# Patient Record
Sex: Female | Born: 1965 | Race: Black or African American | Hispanic: No | Marital: Married | Smoking: Never smoker
Health system: Southern US, Community
[De-identification: ages and names within clinical notes are randomized; demographics above are authoritative.]

## PROBLEM LIST (undated history)

## (undated) DIAGNOSIS — J45909 Unspecified asthma, uncomplicated: Secondary | ICD-10-CM

## (undated) DIAGNOSIS — E559 Vitamin D deficiency, unspecified: Secondary | ICD-10-CM

## (undated) HISTORY — PX: ABDOMINAL HYSTERECTOMY: SHX81

## (undated) HISTORY — PX: TUBAL LIGATION: SHX77

---

## 2004-12-26 ENCOUNTER — Inpatient Hospital Stay: Payer: Self-pay | Admitting: Unknown Physician Specialty

## 2007-06-04 ENCOUNTER — Ambulatory Visit: Payer: Self-pay | Admitting: Family Medicine

## 2007-06-04 ENCOUNTER — Emergency Department: Payer: Self-pay | Admitting: Emergency Medicine

## 2007-06-07 ENCOUNTER — Emergency Department: Payer: Self-pay | Admitting: Emergency Medicine

## 2007-06-07 ENCOUNTER — Ambulatory Visit: Payer: Self-pay | Admitting: Internal Medicine

## 2008-09-13 ENCOUNTER — Ambulatory Visit: Payer: Self-pay | Admitting: Internal Medicine

## 2009-01-20 IMAGING — CT CT ABD-PELV W/ CM
1 of 2 series · 16 of 32 positions shown, 20 images · non-contrast
Comparison: none

REASON FOR EXAM: (1) rlq pain elevated wbc; (2) see above
COMMENTS:

PROCEDURE:     CT  - CT ABDOMEN / PELVIS  W  - June 07, 2007 [DATE]
RESULT:
HISTORY: Pain.

[Series 2: appendicitis · axial · 0.74mm/px · z∈[-892,-484]mm · 16 of 148 slices shown, 20 images]
[im 6/148  soft-tissue]
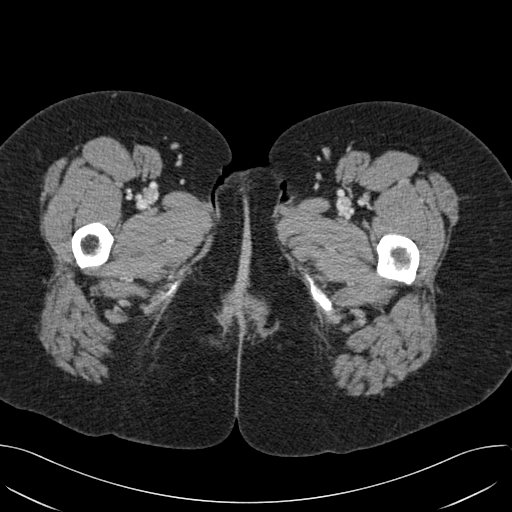
[im 6/148  bone]
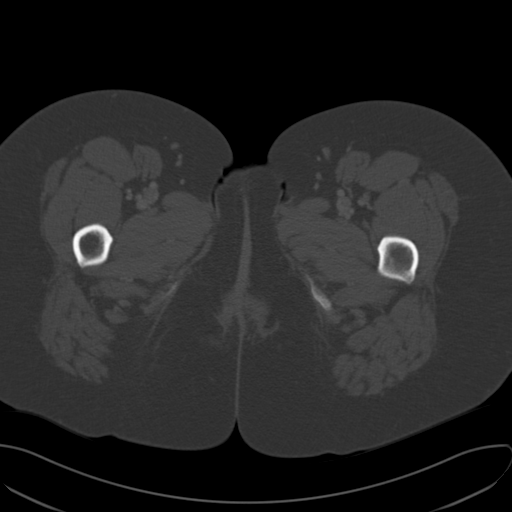
[im 18/148  soft-tissue]
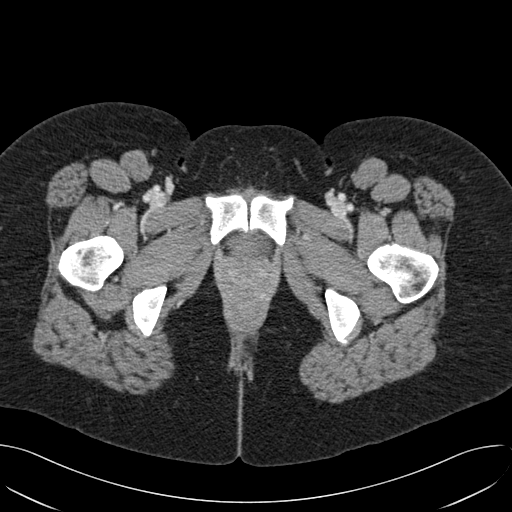
[im 30/148  soft-tissue]
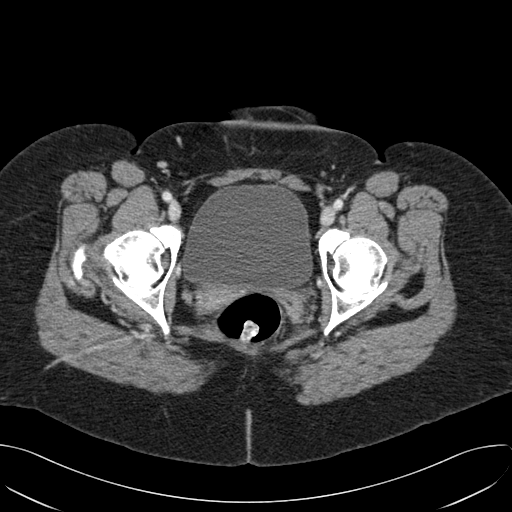
[im 42/148  soft-tissue]
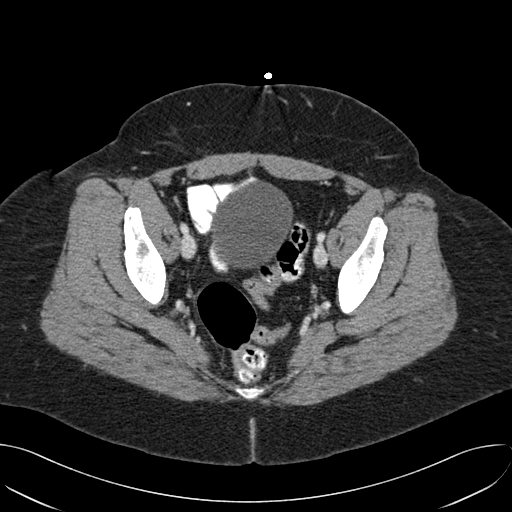
[im 48/148  soft-tissue]
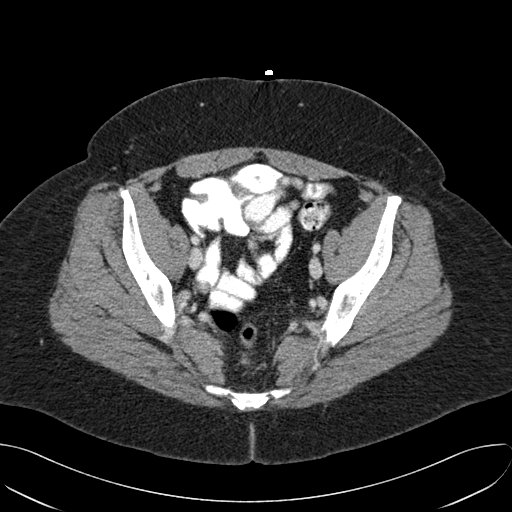
[im 59/148  soft-tissue]
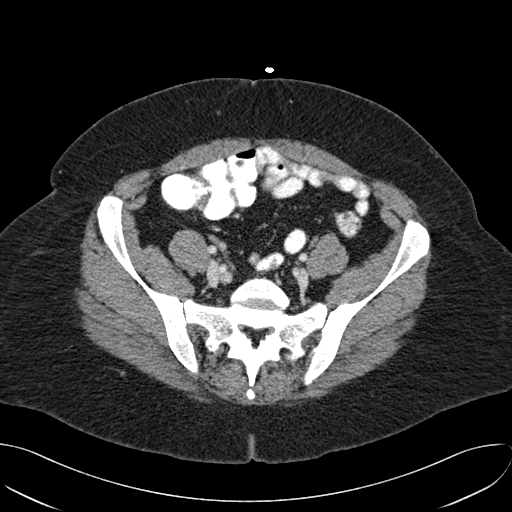
[im 71/148  soft-tissue]
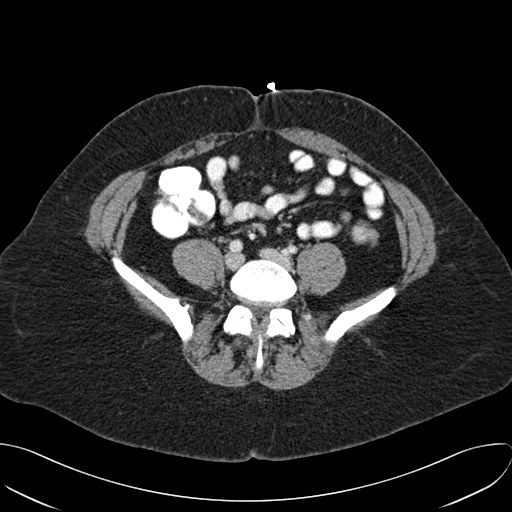
[im 77/148  soft-tissue]
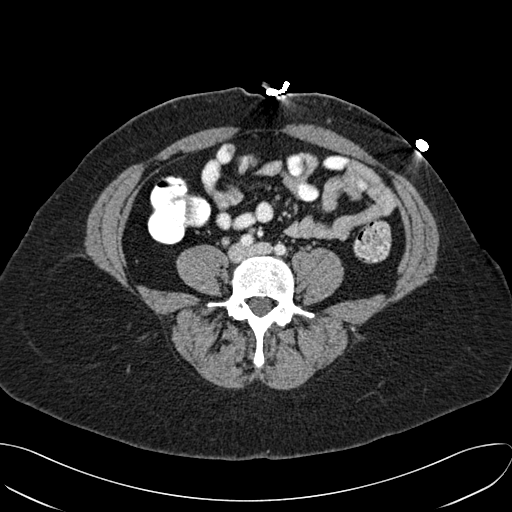
[im 89/148  soft-tissue]
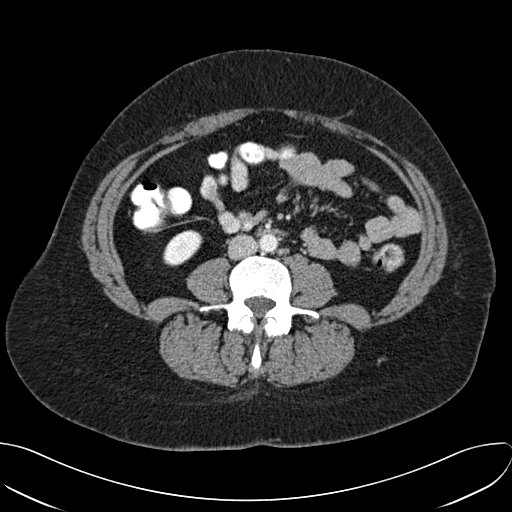
[im 89/148  bone]
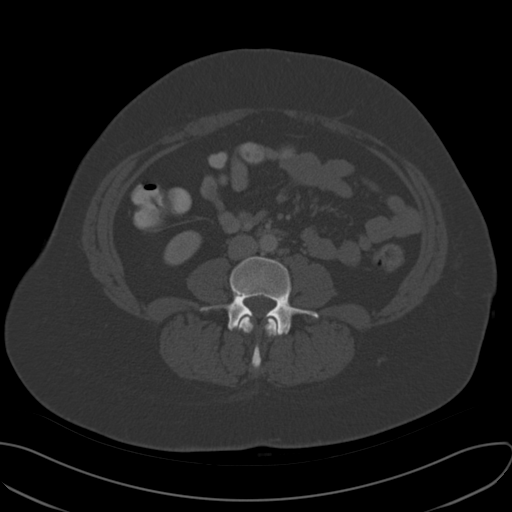
[im 100/148  soft-tissue]
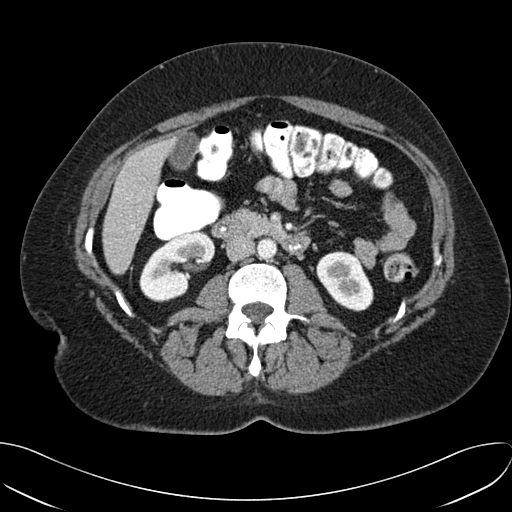
[im 112/148  soft-tissue]
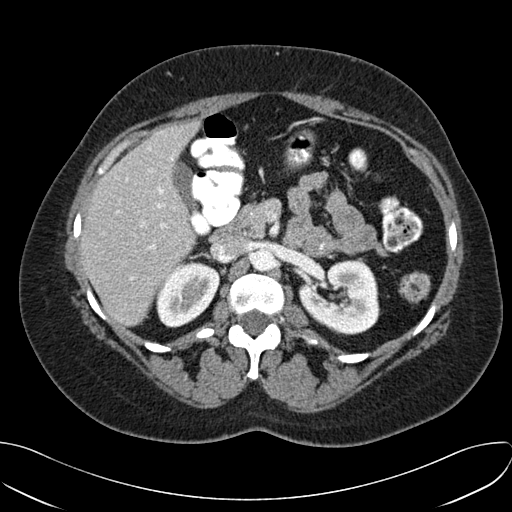
[im 118/148  soft-tissue]
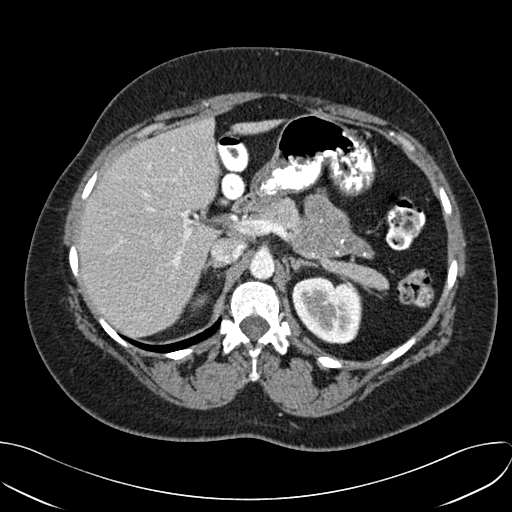
[im 124/148  lung]
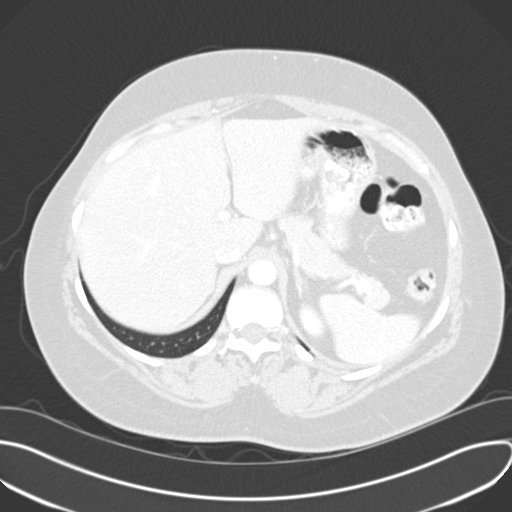
[im 130/148  soft-tissue]
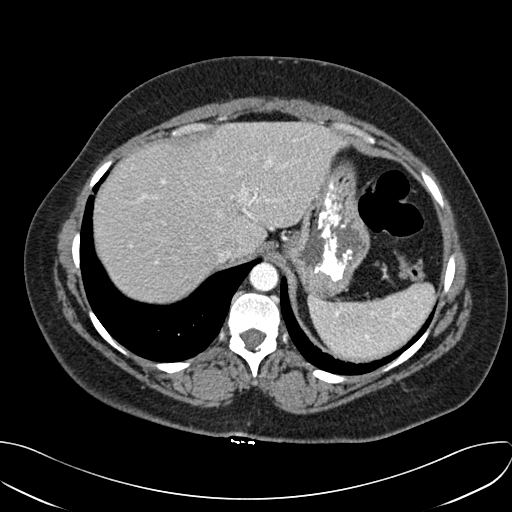
[im 130/148  lung]
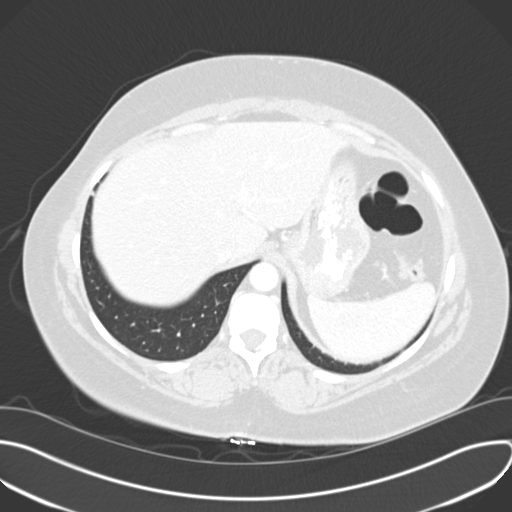
[im 136/148  lung]
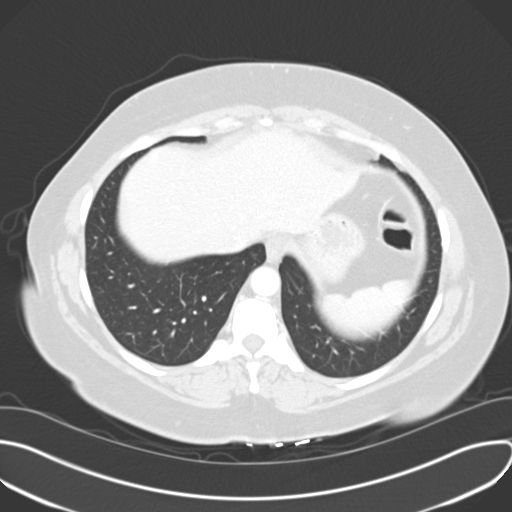
[im 142/148  soft-tissue]
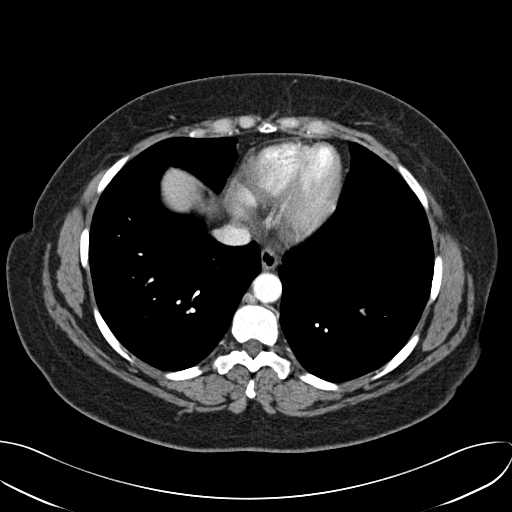
[im 142/148  lung]
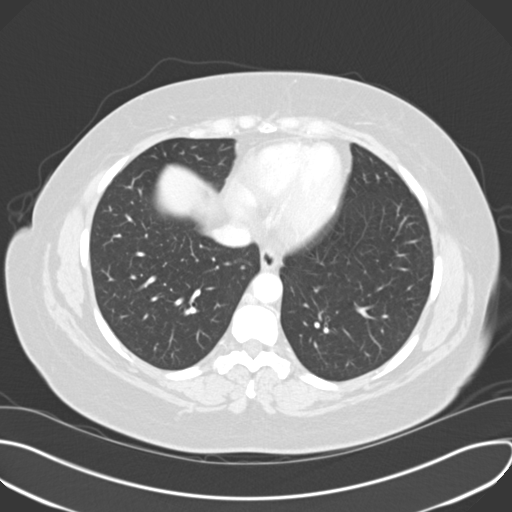

[16 of 32 positions shown; findings below may reference images not displayed]

COMPARISON STUDIES: No recent.

PROCEDURE AND FINDINGS: IV and oral contrast enhanced CT of the abdomen and
pelvis is obtained. The liver and spleen are normal. The adrenals are
normal. The pancreas is normal. The kidneys are normal. There is no evidence
of hydronephrosis. The bladder is unremarkable. The pelvis is unremarkable.
There is no bowel distention. The RIGHT lower quadrant is unremarkable. The
appendix is normal. No inguinal adenopathy is noted. The lung bases are
clear. There is no free air.
IMPRESSION: No acute or focal abnormalities are identified.

## 2009-06-06 ENCOUNTER — Ambulatory Visit: Payer: Self-pay | Admitting: Internal Medicine

## 2010-06-02 ENCOUNTER — Ambulatory Visit: Payer: Self-pay | Admitting: Family Medicine

## 2010-06-03 ENCOUNTER — Ambulatory Visit: Payer: Self-pay | Admitting: Family Medicine

## 2010-10-01 ENCOUNTER — Ambulatory Visit: Payer: Self-pay | Admitting: Family Medicine

## 2011-01-20 IMAGING — CR DG ANKLE COMPLETE 3+V*L*
1 series · 5 of 5 positions shown · non-contrast
Comparison: none

REASON FOR EXAM: injury - pain
COMMENTS:

[Series 1: view not recorded · 0.17mm/px · 5 of 5 slices shown]
[im 1/5]
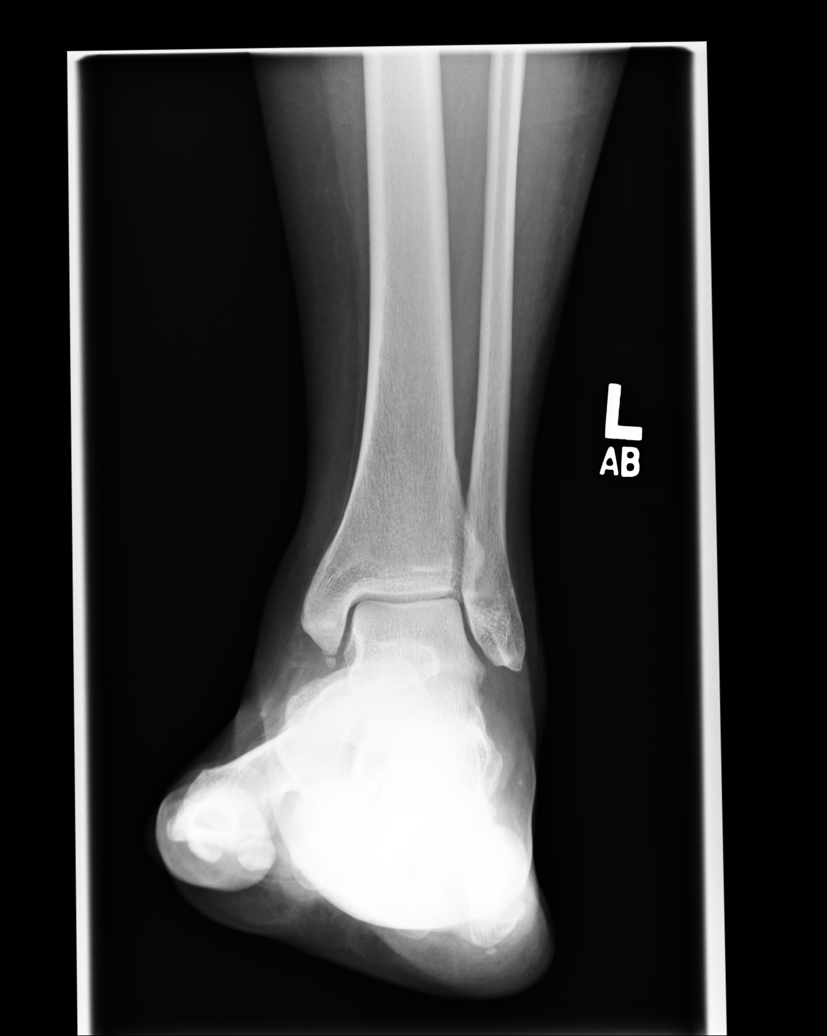
[im 2/5]
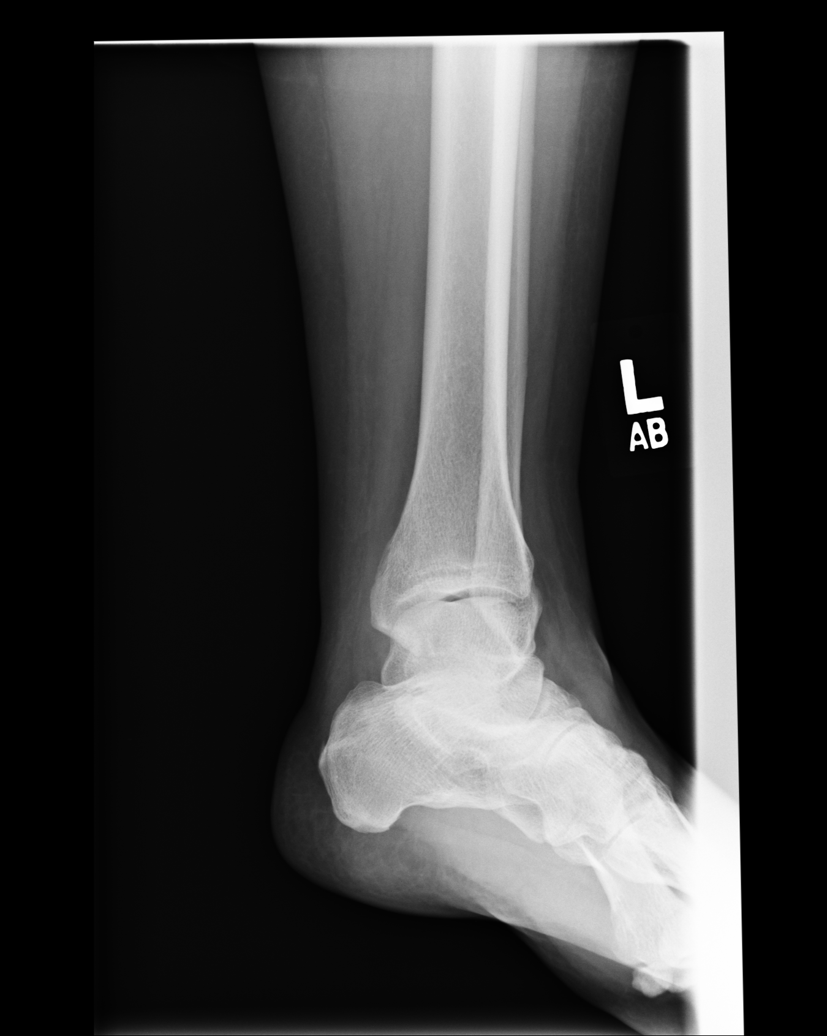
[im 3/5]
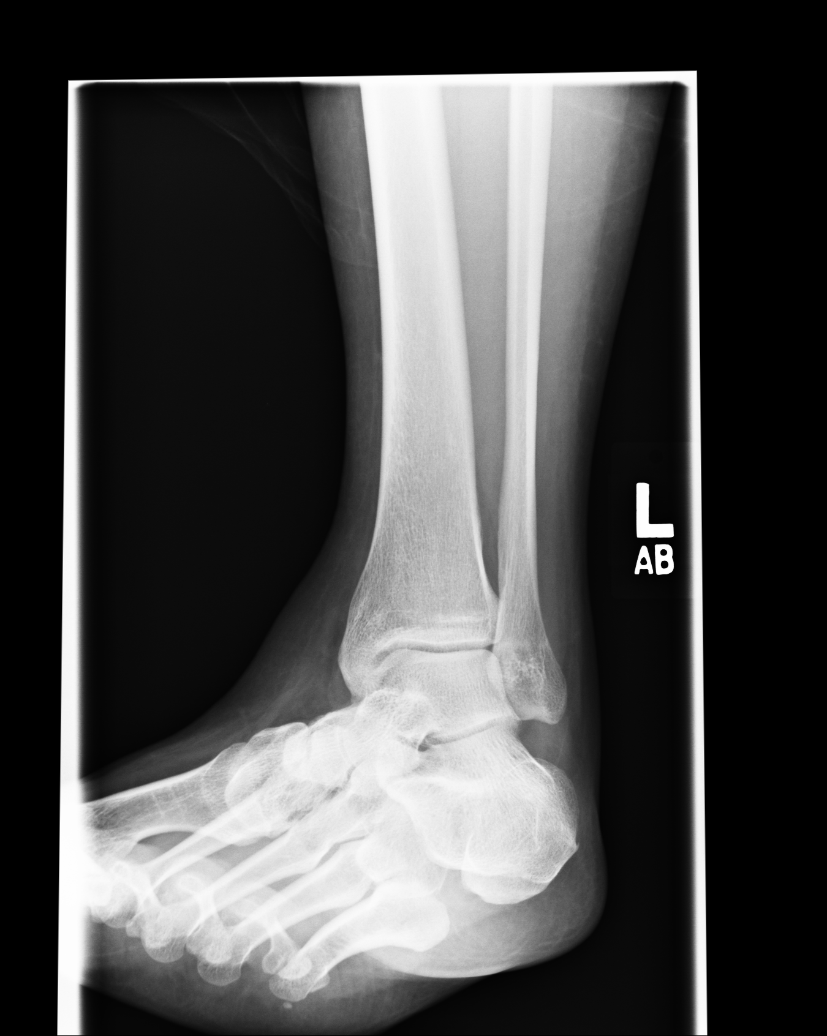
[im 4/5]
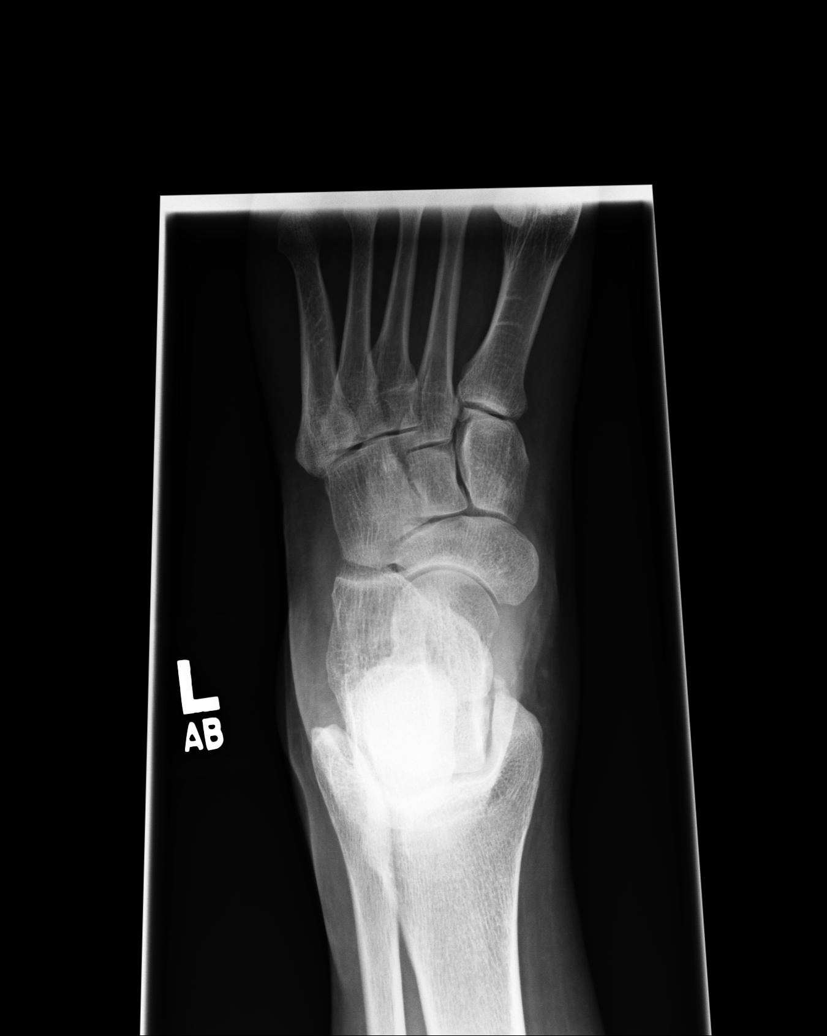
[im 5/5]
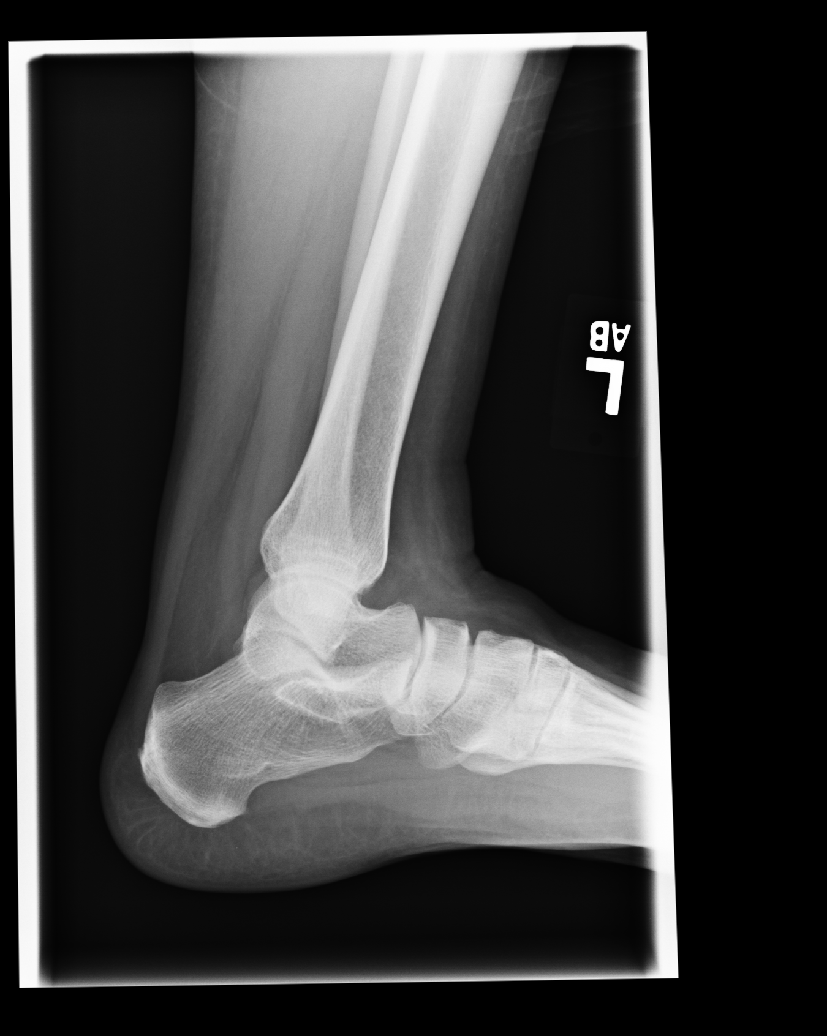

[5 of 5 positions shown; findings below may reference images not displayed]

PROCEDURE:     MDR - MDR ANKLE LEFT COMPLETE  - June 06, 2009  [DATE]

RESULT:     No fracture, dislocation or other acute bony abnormality is
identified. Incidental note is made of an osseous density at the inferior
tip of the medial malleolus compatible with a small sesamoid bone. The ankle
mortise is symmetrical.
IMPRESSION: 1.     No acute bony abnormalities are identified.

## 2012-08-20 ENCOUNTER — Ambulatory Visit: Payer: Self-pay | Admitting: Internal Medicine

## 2013-04-14 ENCOUNTER — Ambulatory Visit: Payer: Self-pay

## 2013-09-08 ENCOUNTER — Ambulatory Visit: Payer: Self-pay | Admitting: Physician Assistant

## 2015-03-02 ENCOUNTER — Other Ambulatory Visit: Payer: Self-pay

## 2015-03-02 ENCOUNTER — Ambulatory Visit
Admission: EM | Admit: 2015-03-02 | Discharge: 2015-03-02 | Disposition: A | Discharge status: Home / Self Care | Payer: Managed Care, Other (non HMO) | Attending: Family Medicine | Admitting: Family Medicine

## 2015-03-02 DIAGNOSIS — R42 Dizziness and giddiness: Secondary | ICD-10-CM | POA: Diagnosis not present

## 2015-03-02 DIAGNOSIS — Z91048 Other nonmedicinal substance allergy status: Secondary | ICD-10-CM

## 2015-03-02 DIAGNOSIS — J4530 Mild persistent asthma, uncomplicated: Secondary | ICD-10-CM | POA: Diagnosis not present

## 2015-03-02 DIAGNOSIS — T7849XA Other allergy, initial encounter: Secondary | ICD-10-CM | POA: Insufficient documentation

## 2015-03-02 DIAGNOSIS — Z79899 Other long term (current) drug therapy: Secondary | ICD-10-CM | POA: Insufficient documentation

## 2015-03-02 DIAGNOSIS — Z9109 Other allergy status, other than to drugs and biological substances: Secondary | ICD-10-CM

## 2015-03-02 DIAGNOSIS — R0602 Shortness of breath: Secondary | ICD-10-CM | POA: Diagnosis present

## 2015-03-02 HISTORY — DX: Vitamin D deficiency, unspecified: E55.9

## 2015-03-02 HISTORY — DX: Unspecified asthma, uncomplicated: J45.909

## 2015-03-02 LAB — GLUCOSE, CAPILLARY: Glucose-Capillary: 92 mg/dL (ref 65–99)

## 2015-03-02 NOTE — ED Provider Notes (Signed)
CSN: [id number]     Arrival date & time 03/02/15  1702 History   First MD Initiated Contact with Patient 03/02/15 1739     Chief Complaint  Patient presents with  . Shortness of Breath  . Dizziness   (Consider location/radiation/quality/duration/timing/severity/associated sxs/prior Treatment) HPI Comments: 49 yo female with a h/o allergies and mild persistent asthma on advair and albuterol presents with a 1 month h/o intermittent shortness of breath and now 1 week of intermittent dizziness/lightheadedness. States has had allergy symptoms and takes claritin for it. Feels ear pressure on both ears. Denies fevers, chills, sore throat, cough, wheezing, vomiting, chest pains, neck or jaw pain, vision changes, numbness/tingling.   Patient is a 49 y.o. female presenting with shortness of breath and dizziness. The history is provided by the patient.  Shortness of Breath Dizziness Associated symptoms: shortness of breath     Past Medical History  Diagnosis Date  . Asthma   . Vitamin D deficiency    Past Surgical History  Procedure Laterality Date  . Abdominal hysterectomy    . Tubal ligation     No family history on file. History  Substance Use Topics  . Smoking status: Never Smoker   . Smokeless tobacco: Never Used  . Alcohol Use: No   OB History    Gravida Para Term Preterm AB TAB SAB Ectopic Multiple Living   4 4             Review of Systems  Respiratory: Positive for shortness of breath.   Neurological: Positive for dizziness.    Allergies  Review of patient's allergies indicates no known allergies.  Home Medications   Prior to Admission medications   Medication Sig Start Date End Date Taking? Authorizing Provider  albuterol (PROVENTIL) (2.5 MG/3ML) 0.083% nebulizer solution Take 2.5 mg by nebulization every 6 (six) hours as needed for wheezing or shortness of breath.   Yes Historical Provider, MD  Fluticasone-Salmeterol (ADVAIR) 100-50 MCG/DOSE AEPB Inhale 1 puff into  the lungs at bedtime.   Yes Historical Provider, MD  loratadine (CLARITIN) 10 MG tablet Take 10 mg by mouth daily.   Yes Historical Provider, MD  Vitamin D, Ergocalciferol, (DRISDOL) 50000 UNITS CAPS capsule Take 50,000 Units by mouth every 7 (seven) days.   Yes Historical Provider, MD   BP 113/64 mmHg  Pulse 76  Temp(Src) 98.4 F (36.9 C) (Oral)  Resp 16  Ht 5\' 4"  (1.626 m)  Wt 196 lb (88.905 kg)  BMI 33.63 kg/m2  SpO2 99% Physical Exam  Constitutional: She is oriented to person, place, and time. She appears well-developed and well-nourished. No distress.  HENT:  Head: Normocephalic.  Right Ear: External ear and ear canal normal. A middle ear effusion is present.  Left Ear: External ear and ear canal normal. A middle ear effusion is present.  Nose: Nose normal.  Mouth/Throat: Oropharynx is clear and moist and mucous membranes are normal.  Eyes: Conjunctivae and EOM are normal. Pupils are equal, round, and reactive to light. Right eye exhibits no discharge. Left eye exhibits no discharge. No scleral icterus.  Neck: Normal range of motion. Neck supple. No JVD present. No tracheal deviation present. No thyromegaly present.  Cardiovascular: Normal rate, regular rhythm, normal heart sounds and intact distal pulses.   No murmur heard. Pulmonary/Chest: Effort normal and breath sounds normal. No stridor. No respiratory distress. She has no wheezes. She has no rales. She exhibits no tenderness.  Musculoskeletal: She exhibits no edema.  Lymphadenopathy:  She has no cervical adenopathy.  Neurological: She is alert and oriented to person, place, and time.  Skin: Skin is warm and dry. No rash noted. She is not diaphoretic.  Vitals reviewed.   ED Course  Procedures (including critical care time) Labs Review Labs Reviewed  GLUCOSE, CAPILLARY  CBG MONITORING, ED    Imaging Review No results found.  EKG: normal EKG, normal sinus rhythm, there are no previous tracings available for  comparison.  MDM   1. Environmental allergies   2. Episodic lightheadedness    Plan: 1. Test/x-ray results and diagnosis reviewed with patient 2. Recommend supportive treatment with otc antihistamines and decongestant; increase fluids; continue current asthma medications 3. F/u with PCP or here prn if symptoms worsen or don't improve    Payton Mccallum, MD 03/02/15 [phone]

## 2015-03-02 NOTE — ED Notes (Signed)
Pt reports she is experiencing SOB (h/o asthma) x 1 month, dizziness/lightheadedness x 1 week, "slight" headache, chest discomfort, sweats. Pt denies arm/back/jaw pain, N/V.

## 2017-11-25 ENCOUNTER — Other Ambulatory Visit: Payer: Self-pay

## 2017-11-25 ENCOUNTER — Ambulatory Visit
Admission: EM | Admit: 2017-11-25 | Discharge: 2017-11-25 | Disposition: A | Discharge status: Home / Self Care | Payer: 59 | Attending: Family Medicine | Admitting: Family Medicine

## 2017-11-25 ENCOUNTER — Encounter: Payer: Self-pay | Admitting: Emergency Medicine

## 2017-11-25 DIAGNOSIS — R0981 Nasal congestion: Secondary | ICD-10-CM

## 2017-11-25 DIAGNOSIS — J029 Acute pharyngitis, unspecified: Secondary | ICD-10-CM

## 2017-11-25 DIAGNOSIS — R509 Fever, unspecified: Secondary | ICD-10-CM | POA: Diagnosis not present

## 2017-11-25 LAB — RAPID STREP SCREEN (MED CTR MEBANE ONLY): Streptococcus, Group A Screen (Direct): NEGATIVE

## 2017-11-25 MED ORDER — LIDOCAINE VISCOUS 2 % MT SOLN: 10.0000 mL | Freq: Three times a day (TID) | OROMUCOSAL | 0 refills | Status: AC | PRN

## 2017-11-25 MED ORDER — AMOXICILLIN 875 MG PO TABS: 875.0000 mg | ORAL_TABLET | Freq: Two times a day (BID) | ORAL | 0 refills | Status: AC

## 2017-11-25 NOTE — Discharge Instructions (Addendum)
Take medication as prescribed. Rest. Drink plenty of fluids.  ° °Follow up with your primary care physician this week as needed. Return to Urgent care for new or worsening concerns.  ° °

## 2017-11-25 NOTE — ED Triage Notes (Signed)
Patient c/o neck pain, chills, sore throat that started on Monday.  Patient reports fevers.

## 2017-11-25 NOTE — ED Provider Notes (Signed)
[facility] ____________________________________________  Time seen: Approximately 1240 PM  I have reviewed the triage vital signs and the nursing notes.   HISTORY  Chief Complaint Sore Throat   HPI Carla Hansen is a 52 y.o. female presenting for evaluation of 2 days of sore throat.  States some chills and fever, reporting T-max of 101.3 yesterday.  Has taken some over-the-counter Tylenol and ibuprofen.  States last took ibuprofen earlier this morning.  States some pain with swallowing but continues to overall eat and drink well.  States mild nasal congestion consistent with her chronic seasonal allergies without change.  Denies cough.  Denies known sick contacts.  Reports otherwise feels well.  Denies other aggravating or relieving factors. Denies chest pain, shortness of breath, abdominal pain, or rash. Denies recent sickness. Denies recent antibiotic use.     Past Medical History:  Diagnosis Date  . Asthma   . Vitamin D deficiency     There are no active problems to display for this patient.   Past Surgical History:  Procedure Laterality Date  . ABDOMINAL HYSTERECTOMY    . TUBAL LIGATION       No current facility-administered medications for this encounter.   Current Outpatient Medications:  .  baclofen (LIORESAL) 10 MG tablet, , Disp: , Rfl: 0 .  levothyroxine (SYNTHROID, LEVOTHROID) 25 MCG tablet, Take by mouth., Disp: , Rfl:  .  loratadine (CLARITIN) 10 MG tablet, Take 10 mg by mouth daily., Disp: , Rfl:  .  Vitamin D, Ergocalciferol, (DRISDOL) 50000 UNITS CAPS capsule, Take 50,000 Units by mouth every 7 (seven) days., Disp: , Rfl:  .  albuterol (PROVENTIL) (2.5 MG/3ML) 0.083% nebulizer solution, Take 2.5 mg by nebulization every 6 (six) hours as needed for wheezing or shortness of breath., Disp: , Rfl:  .  amoxicillin (AMOXIL) 875 MG tablet, Take 1 tablet (875 mg total) by mouth 2 (two) times daily., Disp: 20 tablet, Rfl: 0 .  lidocaine (XYLOCAINE) 2 %  solution, Use as directed 10 mLs in the mouth or throat every 8 (eight) hours as needed (sore throat. gargle and spit as needed for sore throat.)., Disp: 85 mL, Rfl: 0  Allergies Patient has no known allergies.  History reviewed. No pertinent family history.  Social History Social History   Tobacco Use  . Smoking status: Never Smoker  . Smokeless tobacco: Never Used  Substance Use Topics  . Alcohol use: No  . Drug use: No    Review of Systems Constitutional:as above.  Eyes: No visual changes. ENT: positive sore throat. Cardiovascular: Denies chest pain. Respiratory: Denies shortness of breath. Gastrointestinal: No abdominal pain.  No nausea, no vomiting.  No diarrhea.  Musculoskeletal: Negative for back pain. Skin: Negative for rash.  ____________________________________________   PHYSICAL EXAM:  VITAL SIGNS: ED Triage Vitals  Enc Vitals Group     BP 11/25/17 1155 (!) 116/59     Pulse Rate 11/25/17 1155 71     Resp 11/25/17 1155 16     Temp 11/25/17 1155 98 F (36.7 C)     Temp Source 11/25/17 1155 Oral     SpO2 11/25/17 1155 98 %     Weight 11/25/17 1152 228 lb (103.4 kg)     Height 11/25/17 1152 5\' 4"  (1.626 m)     Head Circumference --      Peak Flow --      Pain Score 11/25/17 1151 5     Pain Loc --      Pain  Edu? --      Excl. in GC? --    Constitutional: Alert and oriented. Well appearing and in no acute distress. Eyes: Conjunctivae are normal.  Head: Atraumatic. No sinus tenderness to palpation. No swelling. No erythema.  Ears: no erythema, normal TMs bilaterally.   Nose:No nasal congestion   Mouth/Throat: Mucous membranes are moist. Mild pharyngeal erythema.  Mild bilateral tonsillar swelling, mild bilateral exudate.  No uvular shift or deviation. Neck: No stridor.  No cervical spine tenderness to palpation. Hematological/Lymphatic/Immunilogical: Anterior bilateral cervical lymphadenopathy. Cardiovascular: Normal rate, regular rhythm. Grossly normal  heart sounds.  Good peripheral circulation. Respiratory: Normal respiratory effort.  No retractions. No wheezes, rales or rhonchi. Good air movement.  Musculoskeletal: Ambulatory with steady gait.  Neurologic:  Normal speech and language. No gait instability. Skin:  Skin appears warm, dry and intact. No rash noted. Psychiatric: Mood and affect are normal. Speech and behavior are normal.  ___________________________________________   LABS (all labs ordered are listed, but only abnormal results are displayed)  Labs Reviewed  RAPID STREP SCREEN (NOT AT [facility])  CULTURE, GROUP A STREP [facility])    PROCEDURES Procedures   INITIAL IMPRESSION / ASSESSMENT AND PLAN / ED COURSE  Pertinent labs & imaging results that were available during my care of the patient were reviewed by me and considered in my medical decision making (see chart for details).  Well-appearing patient.  No acute distress.  Quick strep negative, will culture.  However clinical exam consistent with streptococcal pharyngitis.  Will empirically treat with oral amoxicillin.  Encourage rest, fluids, supportive care.  Also Rx for PRN viscous lidocaine solution.Discussed indication, risks and benefits of medications with patient.  Discussed follow up with Primary care physician this week. Discussed follow up and return parameters including no resolution or any worsening concerns. Patient verbalized understanding and agreed to plan.   ____________________________________________   FINAL CLINICAL IMPRESSION(S) / ED DIAGNOSES  Final diagnoses:  Pharyngitis, unspecified etiology     ED Discharge Orders        Ordered    amoxicillin (AMOXIL) 875 MG tablet  2 times daily     11/25/17 1237    lidocaine (XYLOCAINE) 2 % solution  Every 8 hours PRN     11/25/17 1237       Note: This dictation was prepared with Dragon dictation along with smaller phrase technology. Any transcriptional errors that result from this process are  unintentional.         Renford Dills, NP 11/25/17 [phone]

## 2017-11-26 ENCOUNTER — Telehealth (HOSPITAL_COMMUNITY): Payer: Self-pay

## 2017-11-26 NOTE — Telephone Encounter (Signed)
Attempted to contact patient regarding negative test results.

## 2017-11-27 LAB — CULTURE, GROUP A STREP (THRC)

## 2019-03-03 ENCOUNTER — Other Ambulatory Visit: Payer: Self-pay | Admitting: Family Medicine

## 2019-03-03 DIAGNOSIS — Z20822 Contact with and (suspected) exposure to covid-19: Secondary | ICD-10-CM

## 2019-03-08 LAB — NOVEL CORONAVIRUS, NAA: SARS-CoV-2, NAA: NOT DETECTED

## 2019-03-15 ENCOUNTER — Telehealth: Payer: Self-pay | Admitting: General Practice

## 2019-03-15 NOTE — Telephone Encounter (Signed)
Pt called and negative results was given to pt °

## 2021-04-24 ENCOUNTER — Other Ambulatory Visit: Payer: Self-pay

## 2021-04-24 ENCOUNTER — Ambulatory Visit
Admission: EM | Admit: 2021-04-24 | Discharge: 2021-04-24 | Disposition: A | Discharge status: Home / Self Care | Payer: 59 | Attending: Sports Medicine | Admitting: Sports Medicine

## 2021-04-24 DIAGNOSIS — R519 Headache, unspecified: Secondary | ICD-10-CM | POA: Diagnosis present

## 2021-04-24 DIAGNOSIS — J069 Acute upper respiratory infection, unspecified: Secondary | ICD-10-CM | POA: Diagnosis not present

## 2021-04-24 DIAGNOSIS — R059 Cough, unspecified: Secondary | ICD-10-CM

## 2021-04-24 DIAGNOSIS — B349 Viral infection, unspecified: Secondary | ICD-10-CM | POA: Insufficient documentation

## 2021-04-24 DIAGNOSIS — R0981 Nasal congestion: Secondary | ICD-10-CM | POA: Insufficient documentation

## 2021-04-24 DIAGNOSIS — R195 Other fecal abnormalities: Secondary | ICD-10-CM | POA: Diagnosis present

## 2021-04-24 DIAGNOSIS — U071 COVID-19: Secondary | ICD-10-CM | POA: Insufficient documentation

## 2021-04-24 LAB — SARS CORONAVIRUS 2 (TAT 6-24 HRS): SARS Coronavirus 2: POSITIVE — AB

## 2021-04-24 MED ORDER — FLUTICASONE PROPIONATE 50 MCG/ACT NA SUSP: 2.0000 | Freq: Every day | NASAL | 0 refills | Status: AC

## 2021-04-24 MED ORDER — PROMETHAZINE-DM 6.25-15 MG/5ML PO SYRP: 5.0000 mL | ORAL_SOLUTION | Freq: Four times a day (QID) | ORAL | 0 refills | Status: AC | PRN

## 2021-04-24 NOTE — Discharge Instructions (Addendum)
As we discussed, your COVID test was sent to the hospital.  Please isolate until you know your results.  If they are positive you will need to quarantine per the current CDC guidelines.  Someone will contact you with a positive result and update your work note.  They may not always contact you with a negative result.  Please follow along with your results on the app called MyChart. Please see educational handouts. Supportive care, over-the-counter meds as needed for any symptoms you have. Tylenol or Motrin for any fever or discomfort.  Plenty of rest and plenty fluids. I did provide you a work note. I also sent in a medication for cough, and another 1 for your nasal congestion. If your symptoms persist please see your primary care provider. If your symptoms do worsen then please go to the emergency room.

## 2021-04-24 NOTE — ED Triage Notes (Signed)
Pt c/o nasal congestion, headaches, cough and chills since Sunday. Pt states symptoms worse yesterday. Pt denies f/c/n/v, did have several episodes of diarrhea this morning.

## 2021-04-24 NOTE — ED Provider Notes (Signed)
[facility]    CSN: [id number] Arrival date & time: 04/24/21  0808      History   Chief Complaint Chief Complaint  Patient presents with   Cough   Nasal Congestion   Diarrhea    HPI Carla Hansen is a 55 y.o. female.   55 year old female who presents for evaluation of URI symptoms.  She developed cough, nasal congestion, headache, and chills without fever on Sunday.  Her symptoms have been present now for 3 to 4 days.  Initially she thought it was allergies.  Started having some loose stools this morning.  Was unable to get into her primary care provider, which is Dr. Theodis Aguas.  Presents to the urgent care for initial evaluation.  She denies any fevers or shakes.  No abdominal pain, nausea, or vomiting.  No urinary symptoms.  No chest pain or shortness of breath.  She does have a history of asthma and does take Advair as well as albuterol as needed.  She denies any wheezing.  She did do a home COVID test yesterday which was negative.  She is asking for another COVID test.  No flulike symptoms.  No red flag signs or symptoms elicited on history.   Past Medical History:  Diagnosis Date   Asthma    Vitamin D deficiency     There are no problems to display for this patient.   Past Surgical History:  Procedure Laterality Date   ABDOMINAL HYSTERECTOMY     TUBAL LIGATION      OB History     Gravida  4   Para  4   Term      Preterm      AB      Living         SAB      IAB      Ectopic      Multiple      Live Births               Home Medications    Prior to Admission medications   Medication Sig Start Date End Date Taking? Authorizing Provider  albuterol (PROVENTIL) (2.5 MG/3ML) 0.083% nebulizer solution Take 2.5 mg by nebulization every 6 (six) hours as needed for wheezing or shortness of breath.   Yes [provider]  albuterol (VENTOLIN HFA) 108 (90 Base) MCG/ACT inhaler Inhale into the lungs every 6 (six) hours as needed for  wheezing or shortness of breath.   Yes [provider]  EPINEPHrine 0.3 mg/0.3 mL IJ SOAJ injection epinephrine 0.3 mg/0.3 mL injection, auto-injector   Yes [provider]  fluticasone (FLONASE) 50 MCG/ACT nasal spray Place 2 sprays into both nostrils daily. 04/24/21  Yes Delton See, MD  fluticasone-salmeterol (ADVAIR) 250-50 MCG/ACT AEPB SMARTSIG:1 Puff(s) Via Inhaler Every 12 Hours 12/03/20  Yes [provider]  levothyroxine (SYNTHROID, LEVOTHROID) 25 MCG tablet Take by mouth. 04/01/17 04/24/21 Yes [provider]  loratadine (CLARITIN) 10 MG tablet Take 10 mg by mouth daily.   Yes [provider]  promethazine-dextromethorphan (PROMETHAZINE-DM) 6.25-15 MG/5ML syrup Take 5 mLs by mouth 4 (four) times daily as needed for cough. 04/24/21  Yes Delton See, MD  Vitamin D, Ergocalciferol, (DRISDOL) 50000 UNITS CAPS capsule Take 50,000 Units by mouth every 7 (seven) days.   Yes [provider]  amoxicillin (AMOXIL) 875 MG tablet Take 1 tablet (875 mg total) by mouth 2 (two) times daily. 11/25/17   Renford Dills, NP  baclofen (LIORESAL) 10  MG tablet  11/15/17   [provider]  lidocaine (XYLOCAINE) 2 % solution Use as directed 10 mLs in the mouth or throat every 8 (eight) hours as needed (sore throat. gargle and spit as needed for sore throat.). 11/25/17   Renford Dills, NP    Family History History reviewed. No pertinent family history.  Social History Social History   Tobacco Use   Smoking status: Never   Smokeless tobacco: Never  Substance Use Topics   Alcohol use: No   Drug use: No     Allergies   Patient has no known allergies.   Review of Systems Review of Systems  Constitutional:  Positive for chills. Negative for activity change, appetite change, diaphoresis, fatigue and fever.  HENT:  Positive for congestion. Negative for ear pain, postnasal drip, rhinorrhea, sinus pressure, sinus pain, sneezing and sore  throat.   Eyes:  Negative for pain.  Respiratory:  Positive for cough. Negative for chest tightness, shortness of breath and wheezing.   Cardiovascular:  Negative for chest pain and palpitations.  Gastrointestinal:  Negative for abdominal pain, diarrhea, nausea and vomiting.  Genitourinary:  Negative for dysuria.  Musculoskeletal:  Negative for back pain, myalgias and neck pain.  Skin:  Negative for color change, pallor, rash and wound.  Neurological:  Positive for headaches. Negative for dizziness, syncope, weakness and light-headedness.  All other systems reviewed and are negative.   Physical Exam Triage Vital Signs ED Triage Vitals  Enc Vitals Group     BP 04/24/21 0828 (!) 145/89     Pulse Rate 04/24/21 0828 80     Resp 04/24/21 0828 18     Temp 04/24/21 0828 99.9 F (37.7 C)     Temp Source 04/24/21 0828 Oral     SpO2 04/24/21 0828 100 %     Weight 04/24/21 0825 203 lb (92.1 kg)     Height 04/24/21 0825 5\' 4"  (1.626 m)     Head Circumference --      Peak Flow --      Pain Score 04/24/21 0824 0     Pain Loc --      Pain Edu? --      Excl. in GC? --    No data found.  Updated Vital Signs BP (!) 145/89 (BP Location: Left Arm)   Pulse 80   Temp 99.9 F (37.7 C) (Oral)   Resp 18   Ht 5\' 4"  (1.626 m)   Wt 92.1 kg   SpO2 100%   BMI 34.84 kg/m   Visual Acuity Right Eye Distance:   Left Eye Distance:   Bilateral Distance:    Right Eye Near:   Left Eye Near:    Bilateral Near:     Physical Exam Vitals and nursing note reviewed.  Constitutional:      General: She is not in acute distress.    Appearance: Normal appearance. She is not ill-appearing, toxic-appearing or diaphoretic.  HENT:     Head: Normocephalic and atraumatic.     Right Ear: Tympanic membrane normal.     Left Ear: Tympanic membrane normal.     Nose: Congestion present. No rhinorrhea.     Mouth/Throat:     Mouth: Mucous membranes are moist.     Pharynx: No oropharyngeal exudate or posterior  oropharyngeal erythema.  Eyes:     General: No scleral icterus.       Right eye: No discharge.        Left eye: No discharge.  Extraocular Movements: Extraocular movements intact.     Conjunctiva/sclera: Conjunctivae normal.     Pupils: Pupils are equal, round, and reactive to light.  Cardiovascular:     Rate and Rhythm: Normal rate and regular rhythm.     Pulses: Normal pulses.     Heart sounds: Normal heart sounds. No murmur heard.   No friction rub. No gallop.  Pulmonary:     Effort: Pulmonary effort is normal.     Breath sounds: Normal breath sounds. No stridor. No wheezing, rhonchi or rales.  Musculoskeletal:     Cervical back: Normal range of motion and neck supple. No rigidity.  Skin:    General: Skin is warm and dry.     Capillary Refill: Capillary refill takes less than 2 seconds.     Coloration: Skin is not jaundiced.     Findings: No erythema or rash.  Neurological:     General: No focal deficit present.     Mental Status: She is alert and oriented to person, place, and time.     UC Treatments / Results  Labs (all labs ordered are listed, but only abnormal results are displayed) Labs Reviewed  SARS CORONAVIRUS 2 (TAT 6-24 HRS)    EKG   Radiology No results found.  Procedures Procedures (including critical care time)  Medications Ordered in UC Medications - No data to display  Initial Impression / Assessment and Plan / UC Course  I have reviewed the triage vital signs and the nursing notes.  Pertinent labs & imaging results that were available during my care of the patient were reviewed by me and considered in my medical decision making (see chart for details).  Clinical impression: 1.  Upper respiratory infection, viral in nature 2.  Cough 3.  Nasal congestion 4.  Headache due to a viral illness 5.  Loose stools  Treatment plan: 1.  The findings and treatment plan were discussed in detail with the patient.  Patient was in agreement. 2.  We  will go ahead and test her for COVID.  Send it to the hospital. 3.  Work note was provided. 4.  Educational handouts provided. 5.  Supportive care, over-the-counter meds as needed. 6.  I did prescribe a cough medicine and another medicine for her nasal congestion.  She can get that from the pharmacy. 7.  Plenty of rest, plenty fluids. 8.  If symptoms persist she should see her PCP. 9.  If symptoms worsen she should go to the ER. 10.  She was discharged in stable condition and will follow-up here as needed.    Final Clinical Impressions(s) / UC Diagnoses   Final diagnoses:  Upper respiratory tract infection, unspecified type  Cough  Nasal congestion  Headache due to viral infection  Loose stools     Discharge Instructions      As we discussed, your COVID test was sent to the hospital.  Please isolate until you know your results.  If they are positive you will need to quarantine per the current CDC guidelines.  Someone will contact you with a positive result and update your work note.  They may not always contact you with a negative result.  Please follow along with your results on the app called MyChart. Please see educational handouts. Supportive care, over-the-counter meds as needed for any symptoms you have. Tylenol or Motrin for any fever or discomfort.  Plenty of rest and plenty fluids. I did provide you a work note. I also sent in a  medication for cough, and another 1 for your nasal congestion. If your symptoms persist please see your primary care provider. If your symptoms do worsen then please go to the emergency room.     ED Prescriptions     Medication Sig Dispense Auth. Provider   promethazine-dextromethorphan (PROMETHAZINE-DM) 6.25-15 MG/5ML syrup Take 5 mLs by mouth 4 (four) times daily as needed for cough. 180 mL Delton See, MD   fluticasone [facility]) 50 MCG/ACT nasal spray Place 2 sprays into both nostrils daily. 15.8 mL Delton See, MD      PDMP  not reviewed this encounter.   Delton See, MD 04/24/21 [phone]
